# Patient Record
Sex: Female | Born: 1960 | Race: Black or African American | Hispanic: No | Marital: Married | Smoking: Never smoker
Health system: Southern US, Community
[De-identification: ages and names within clinical notes are randomized; demographics above are authoritative.]

## PROBLEM LIST (undated history)

## (undated) DIAGNOSIS — E119 Type 2 diabetes mellitus without complications: Secondary | ICD-10-CM

## (undated) HISTORY — PX: PARTIAL HYSTERECTOMY: SHX80

## (undated) HISTORY — PX: ROTATOR CUFF REPAIR: SHX139

---

## 2008-02-03 ENCOUNTER — Emergency Department (HOSPITAL_COMMUNITY): Admission: EM | Admit: 2008-02-03 | Discharge: 2008-02-03 | Payer: Self-pay | Admitting: Emergency Medicine

## 2008-07-06 ENCOUNTER — Emergency Department (HOSPITAL_COMMUNITY): Admission: EM | Admit: 2008-07-06 | Discharge: 2008-07-06 | Payer: Self-pay | Admitting: Emergency Medicine

## 2008-10-27 ENCOUNTER — Emergency Department (HOSPITAL_COMMUNITY): Admission: EM | Admit: 2008-10-27 | Discharge: 2008-10-27 | Payer: Self-pay | Admitting: Emergency Medicine

## 2011-03-25 LAB — OCCULT BLOOD X 1 CARD TO LAB, STOOL: Fecal Occult Bld: NEGATIVE

## 2013-04-09 ENCOUNTER — Emergency Department: Payer: Self-pay | Admitting: Emergency Medicine

## 2014-06-04 ENCOUNTER — Emergency Department: Payer: Self-pay | Admitting: Emergency Medicine

## 2019-06-17 ENCOUNTER — Encounter: Payer: Self-pay | Admitting: *Deleted

## 2019-06-17 ENCOUNTER — Ambulatory Visit
Admission: EM | Admit: 2019-06-17 | Discharge: 2019-06-17 | Disposition: A | Discharge status: Home / Self Care | Payer: Managed Care, Other (non HMO)

## 2019-06-17 DIAGNOSIS — Z20828 Contact with and (suspected) exposure to other viral communicable diseases: Secondary | ICD-10-CM

## 2019-06-17 DIAGNOSIS — Z20822 Contact with and (suspected) exposure to covid-19: Secondary | ICD-10-CM

## 2019-06-17 HISTORY — DX: Type 2 diabetes mellitus without complications: E11.9

## 2019-06-17 NOTE — Discharge Instructions (Addendum)
Your COVID test is pending.  You should self quarantine until your test result is back and is negative.   ° °Go to the emergency department if you develop high fever, shortness of breath, severe diarrhea, or other concerning symptoms.   ° °

## 2019-06-17 NOTE — ED Provider Notes (Signed)
Roderic Palau    CSN: [id number] Arrival date & time: 06/17/19  1019      History   Chief Complaint Chief Complaint  Patient presents with  . COVID Testing    HPI Vickie Nelson is a 58 y.o. female.   Patient presents with request for COVID test after close exposure at work.  Patient is asymptomatic.  She denies fever, chills, congestion, cough, shortness of breath, vomiting, diarrhea, rash, or other symptoms.  No treatments attempted at home.   The history is provided by the patient.    Past Medical History:  Diagnosis Date  . Diabetes mellitus without complication ([place])     There are no problems to display for this patient.   Past Surgical History:  Procedure Laterality Date  . PARTIAL HYSTERECTOMY    . ROTATOR CUFF REPAIR      OB History   No obstetric history on file.      Home Medications    Prior to Admission medications   Medication Sig Start Date End Date Taking? Authorizing Provider  aspirin 81 MG chewable tablet Chew by mouth daily.   Yes [provider]  atorvastatin (LIPITOR) 20 MG tablet Take 20 mg by mouth daily.   Yes [provider]  metFORMIN (GLUCOPHAGE) 1000 MG tablet Take 1,000 mg by mouth 2 (two) times daily with a meal.   Yes [provider]  triamterene-hydrochlorothiazide (MAXZIDE-25) 37.5-25 MG tablet Take 1 tablet by mouth daily.   Yes [provider]    Family History Family History  Problem Relation Age of Onset  . Hypertension Mother   . Diabetes Mother   . Cancer Mother   . Hypertension Father   . Cancer Father     Social History Social History   Tobacco Use  . Smoking status: Never Smoker  . Smokeless tobacco: Never Used  Substance Use Topics  . Alcohol use: Not Currently  . Drug use: Not on file     Allergies   Patient has no known allergies.   Review of Systems Review of Systems  Constitutional: Negative for chills and fever.  HENT: Negative for congestion,  ear pain, rhinorrhea and sore throat.   Eyes: Negative for pain and visual disturbance.  Respiratory: Negative for cough and shortness of breath.   Cardiovascular: Negative for chest pain and palpitations.  Gastrointestinal: Negative for abdominal pain, diarrhea and vomiting.  Genitourinary: Negative for dysuria and hematuria.  Musculoskeletal: Negative for arthralgias and back pain.  Skin: Negative for color change and rash.  Neurological: Negative for seizures and syncope.  All other systems reviewed and are negative.    Physical Exam Triage Vital Signs ED Triage Vitals [06/17/19 1020]  Enc Vitals Group     BP      Pulse      Resp      Temp      Temp src      SpO2      Weight      Height      Head Circumference      Peak Flow      Pain Score 0     Pain Loc      Pain Edu?      Excl. in [place]?    No data found.  Updated Vital Signs BP 117/82 (BP Location: Left Arm)   Pulse 83   Temp 98.2 F (36.8 C) (Oral)   Resp 17   SpO2 98%   Visual Acuity Right Eye  Distance:   Left Eye Distance:   Bilateral Distance:    Right Eye Near:   Left Eye Near:    Bilateral Near:     Physical Exam Vitals and nursing note reviewed.  Constitutional:      General: She is not in acute distress.    Appearance: She is well-developed. She is not ill-appearing.  HENT:     Head: Normocephalic and atraumatic.     Right Ear: Tympanic membrane normal.     Left Ear: Tympanic membrane normal.     Nose: Nose normal.     Mouth/Throat:     Mouth: Mucous membranes are moist.     Pharynx: Oropharynx is clear.  Eyes:     Conjunctiva/sclera: Conjunctivae normal.  Cardiovascular:     Rate and Rhythm: Normal rate and regular rhythm.     Heart sounds: No murmur.  Pulmonary:     Effort: Pulmonary effort is normal. No respiratory distress.     Breath sounds: Normal breath sounds.  Abdominal:     General: Bowel sounds are normal.     Palpations: Abdomen is soft.     Tenderness: There is no  abdominal tenderness. There is no guarding or rebound.  Musculoskeletal:     Cervical back: Neck supple.  Skin:    General: Skin is warm and dry.     Findings: No rash.  Neurological:     General: No focal deficit present.     Mental Status: She is alert and oriented to person, place, and time.  Psychiatric:        Mood and Affect: Mood normal.        Behavior: Behavior normal.      UC Treatments / Results  Labs (all labs ordered are listed, but only abnormal results are displayed) Labs Reviewed  NOVEL CORONAVIRUS, NAA    EKG   Radiology No results found.  Procedures Procedures (including critical care time)  Medications Ordered in UC Medications - No data to display  Initial Impression / Assessment and Plan / UC Course  I have reviewed the triage vital signs and the nursing notes.  Pertinent labs & imaging results that were available during my care of the patient were reviewed by me and considered in my medical decision making (see chart for details).    Exposure to COVID positive coworker.  COVID test performed here.  Instructed patient to self quarantine until the test result is back.  Instructed patient to go to the emergency department if she develops high fever, shortness of breath, severe diarrhea, or other concerning symptoms.  Patient agrees with plan of care.     Final Clinical Impressions(s) / UC Diagnoses   Final diagnoses:  Exposure to COVID-19 virus     Discharge Instructions     Your COVID test is pending.  You should self quarantine until your test result is back and is negative.    Go to the emergency department if you develop high fever, shortness of breath, severe diarrhea, or other concerning symptoms.       ED Prescriptions    None     PDMP not reviewed this encounter.   Mickie Bail, NP 06/17/19 1058

## 2019-06-17 NOTE — ED Triage Notes (Signed)
Patient reports exposure to COVID positive employee at work. No symptoms.

## 2019-06-18 ENCOUNTER — Other Ambulatory Visit: Payer: Self-pay

## 2019-06-18 LAB — NOVEL CORONAVIRUS, NAA: SARS-CoV-2, NAA: NOT DETECTED

## 2021-01-15 ENCOUNTER — Other Ambulatory Visit: Payer: Self-pay

## 2021-01-15 ENCOUNTER — Emergency Department: Payer: BC Managed Care – PPO

## 2021-01-15 ENCOUNTER — Emergency Department
Admission: EM | Admit: 2021-01-15 | Discharge: 2021-01-15 | Disposition: A | Discharge status: Home / Self Care | Payer: BC Managed Care – PPO | Attending: Emergency Medicine | Admitting: Emergency Medicine

## 2021-01-15 ENCOUNTER — Encounter: Payer: Self-pay | Admitting: Emergency Medicine

## 2021-01-15 DIAGNOSIS — R0781 Pleurodynia: Secondary | ICD-10-CM | POA: Diagnosis not present

## 2021-01-15 DIAGNOSIS — E119 Type 2 diabetes mellitus without complications: Secondary | ICD-10-CM | POA: Insufficient documentation

## 2021-01-15 DIAGNOSIS — M25512 Pain in left shoulder: Secondary | ICD-10-CM | POA: Diagnosis not present

## 2021-01-15 DIAGNOSIS — R1031 Right lower quadrant pain: Secondary | ICD-10-CM

## 2021-01-15 DIAGNOSIS — Z7984 Long term (current) use of oral hypoglycemic drugs: Secondary | ICD-10-CM | POA: Insufficient documentation

## 2021-01-15 DIAGNOSIS — M549 Dorsalgia, unspecified: Secondary | ICD-10-CM | POA: Insufficient documentation

## 2021-01-15 DIAGNOSIS — Y92410 Unspecified street and highway as the place of occurrence of the external cause: Secondary | ICD-10-CM | POA: Diagnosis not present

## 2021-01-15 DIAGNOSIS — S301XXA Contusion of abdominal wall, initial encounter: Secondary | ICD-10-CM | POA: Diagnosis not present

## 2021-01-15 DIAGNOSIS — Z7982 Long term (current) use of aspirin: Secondary | ICD-10-CM | POA: Insufficient documentation

## 2021-01-15 DIAGNOSIS — S3991XA Unspecified injury of abdomen, initial encounter: Secondary | ICD-10-CM | POA: Diagnosis present

## 2021-01-15 LAB — COMPREHENSIVE METABOLIC PANEL
ALT: 22 U/L (ref 0–44)
AST: 28 U/L (ref 15–41)
Albumin: 3.7 g/dL (ref 3.5–5.0)
Alkaline Phosphatase: 73 U/L (ref 38–126)
Anion gap: 8 (ref 5–15)
BUN: 12 mg/dL (ref 6–20)
CO2: 28 mmol/L (ref 22–32)
Calcium: 9.2 mg/dL (ref 8.9–10.3)
Chloride: 104 mmol/L (ref 98–111)
Creatinine, Ser: 0.74 mg/dL (ref 0.44–1.00)
GFR, Estimated: >60 mL/min (ref >60)
Glucose, Bld: 144 mg/dL — ABNORMAL HIGH (ref 70–99)
Potassium: 3.4 mmol/L — ABNORMAL LOW (ref 3.5–5.1)
Sodium: 140 mmol/L (ref 135–145)
Total Bilirubin: 0.6 mg/dL (ref 0.3–1.2)
Total Protein: 6.8 g/dL (ref 6.5–8.1)

## 2021-01-15 LAB — CBC WITH DIFFERENTIAL/PLATELET
Abs Immature Granulocytes: 0.03 10*3/uL (ref 0.00–0.07)
Basophils Absolute: 0 10*3/uL (ref 0.0–0.1)
Basophils Relative: 0 %
Eosinophils Absolute: 0.1 10*3/uL (ref 0.0–0.5)
Eosinophils Relative: 1 %
HCT: 38.7 % (ref 36.0–46.0)
Hemoglobin: 12.2 g/dL (ref 12.0–15.0)
Immature Granulocytes: 0 %
Lymphocytes Relative: 32 %
Lymphs Abs: 2.6 10*3/uL (ref 0.7–4.0)
MCH: 25.5 pg — ABNORMAL LOW (ref 26.0–34.0)
MCHC: 31.5 g/dL (ref 30.0–36.0)
MCV: 81 fL (ref 80.0–100.0)
Monocytes Absolute: 0.5 10*3/uL (ref 0.1–1.0)
Monocytes Relative: 6 %
Neutro Abs: 4.9 10*3/uL (ref 1.7–7.7)
Neutrophils Relative %: 61 %
Platelets: 218 10*3/uL (ref 150–400)
RBC: 4.78 MIL/uL (ref 3.87–5.11)
RDW: 16.3 % — ABNORMAL HIGH (ref 11.5–15.5)
WBC: 8.1 10*3/uL (ref 4.0–10.5)
nRBC: 0 % (ref 0.0–0.2)

## 2021-01-15 LAB — TROPONIN I (HIGH SENSITIVITY)
Troponin I (High Sensitivity): <2 ng/L (ref <18)
Troponin I (High Sensitivity): <2 ng/L (ref <18)

## 2021-01-15 MED ORDER — METHOCARBAMOL 500 MG PO TABS
1000.0000 mg | ORAL_TABLET | Freq: Once | ORAL | Status: AC
  Administered 2021-01-15: 1000 mg via ORAL
  Filled 2021-01-15: qty 2

## 2021-01-15 MED ORDER — OXYCODONE-ACETAMINOPHEN 5-325 MG PO TABS
1.0000 | ORAL_TABLET | Freq: Once | ORAL | Status: AC
  Administered 2021-01-15: 1 via ORAL
  Filled 2021-01-15: qty 1

## 2021-01-15 MED ORDER — IOHEXOL 300 MG/ML  SOLN
100.0000 mL | Freq: Once | INTRAMUSCULAR | Status: AC | PRN
  Administered 2021-01-15: 100 mL via INTRAVENOUS
  Filled 2021-01-15: qty 100

## 2021-01-15 MED ORDER — MORPHINE SULFATE (PF) 4 MG/ML IV SOLN
4.0000 mg | Freq: Once | INTRAVENOUS | Status: AC
Start: 2021-01-15 — End: 2021-01-15
  Administered 2021-01-15: 4 mg via INTRAVENOUS
  Filled 2021-01-15: qty 1

## 2021-01-15 MED ORDER — SODIUM CHLORIDE 0.9 % IV BOLUS
1000.0000 mL | Freq: Once | INTRAVENOUS | Status: AC
  Administered 2021-01-15: 1000 mL via INTRAVENOUS

## 2021-01-15 MED ORDER — HYDROCODONE-ACETAMINOPHEN 5-325 MG PO TABS: 1.0000 | ORAL_TABLET | ORAL | 0 refills | Status: AC | PRN

## 2021-01-15 MED ORDER — METHOCARBAMOL 500 MG PO TABS: 500.0000 mg | ORAL_TABLET | Freq: Four times a day (QID) | ORAL | 0 refills | Status: AC

## 2021-01-15 MED ORDER — MELOXICAM 15 MG PO TABS: 15.0000 mg | ORAL_TABLET | Freq: Every day | ORAL | 0 refills | Status: AC

## 2021-01-15 MED ORDER — ONDANSETRON HCL 4 MG/2ML IJ SOLN
4.0000 mg | Freq: Once | INTRAMUSCULAR | Status: AC
Start: 2021-01-15 — End: 2021-01-15
  Administered 2021-01-15: 4 mg via INTRAVENOUS
  Filled 2021-01-15: qty 2
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
## 2021-01-15 NOTE — ED Provider Notes (Signed)
Medical screening examination/treatment/procedure(s) were performed by non-physician practitioner and as supervising physician I was immediately available for consultation/collaboration.     Sharyn Creamer, MD 01/16/21 [id number]
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
